# Patient Record
Sex: Male | Born: 1965 | Race: White | Hispanic: No | Marital: Married | Smoking: Never smoker
Health system: Southern US, Community
[De-identification: ages and names within clinical notes are randomized; demographics above are authoritative.]

## PROBLEM LIST (undated history)

## (undated) DIAGNOSIS — M109 Gout, unspecified: Secondary | ICD-10-CM

## (undated) DIAGNOSIS — K200 Eosinophilic esophagitis: Secondary | ICD-10-CM

## (undated) DIAGNOSIS — E221 Hyperprolactinemia: Secondary | ICD-10-CM

---

## 2009-11-01 DIAGNOSIS — C439 Malignant melanoma of skin, unspecified: Secondary | ICD-10-CM

## 2009-11-01 HISTORY — DX: Malignant melanoma of skin, unspecified: C43.9

## 2011-10-03 DIAGNOSIS — D229 Melanocytic nevi, unspecified: Secondary | ICD-10-CM

## 2011-10-03 HISTORY — DX: Melanocytic nevi, unspecified: D22.9

## 2012-08-08 ENCOUNTER — Emergency Department (HOSPITAL_BASED_OUTPATIENT_CLINIC_OR_DEPARTMENT_OTHER)
Admission: EM | Admit: 2012-08-08 | Discharge: 2012-08-09 | Disposition: A | Discharge status: Home / Self Care | Payer: 59 | Attending: Emergency Medicine | Admitting: Emergency Medicine

## 2012-08-08 ENCOUNTER — Encounter (HOSPITAL_BASED_OUTPATIENT_CLINIC_OR_DEPARTMENT_OTHER): Payer: Self-pay | Admitting: Emergency Medicine

## 2012-08-08 DIAGNOSIS — R0789 Other chest pain: Secondary | ICD-10-CM

## 2012-08-08 DIAGNOSIS — R071 Chest pain on breathing: Secondary | ICD-10-CM | POA: Insufficient documentation

## 2012-08-08 NOTE — ED Provider Notes (Signed)
History  This chart was scribed for Hector Brooks Smitty Cords, MD by Shari Heritage, ED Scribe. The patient was seen in room MH02/MH02. Patient's care was started at 2351.   CSN: [id number]  Arrival date & time 08/08/12  2347   First MD Initiated Contact with Patient 08/08/12 2351      Chief Complaint  Patient presents with  . Chest Pain    Patient is a 47 y.o. male presenting with chest pain. The history is provided by the patient. No language interpreter was used.  Chest Pain Pain location:  R chest Pain quality: sharp   Pain radiates to the back: no   Pain severity:  Moderate Onset quality:  Gradual Duration:  14 hours Timing:  Intermittent Progression:  Unchanged Chronicity:  New Relieved by:  Nothing Worsened by:  Nothing tried Ineffective treatments:  Antacids (ibuprofen) Associated symptoms: no abdominal pain, no back pain, no cough, no diaphoresis, no fever, no headache, no nausea, no palpitations, no shortness of breath and not vomiting   Risk factors: no coronary artery disease, no diabetes mellitus, no hypertension and no prior DVT/PE      HPI Comments: Hector Mendez is a 47 y.o. male who presents to the Emergency Department complaining of intermittent, sharp chest pain for the past 14 hours. He states that pain is located across his chest but radiates mostly to the right. Patient states that pain is worse while lying down and is relieved mildly with certain movements. He denies acid-like or burning taste in the back of his throat. He states that he did bench presses 2 days ago and suspects that current symptoms may be muscle strain. He took 2 motrin 1 hour ago and took an antacid a couple hours ago after dinner. He denies leg swelling or hemoptysis. Patient denies vomiting, diarrhea, abdominal pain, cough, shortness of breath, fever, chills, sore throat, congestion, rhinorrhea, headaches, back pain, diaphoresis, dysuria, urinary frequency, hematuria or visual changes. He  hasn't taken any recent long plane or car trips.  Patient uses chewing tobacco and does not drink alcohol. He reports no pertinent past medical history.    History reviewed. No pertinent past medical history.  No past surgical history on file.  No family history on file.  History  Substance Use Topics  . Smoking status: Not on file  . Smokeless tobacco: Not on file  . Alcohol Use: Not on file      Review of Systems  Constitutional: Negative for fever, chills and diaphoresis.  HENT: Negative for congestion, sore throat, rhinorrhea and neck pain.   Eyes: Negative for visual disturbance.  Respiratory: Negative for cough, chest tightness and shortness of breath.   Cardiovascular: Positive for chest pain. Negative for palpitations and leg swelling.  Gastrointestinal: Negative for nausea, vomiting, abdominal pain, diarrhea and constipation.  Genitourinary: Negative for dysuria, frequency and hematuria.  Musculoskeletal: Negative for back pain.  Skin: Negative for rash.  Neurological: Negative for headaches.  All other systems reviewed and are negative.    Allergies  Review of patient's allergies indicates not on file.  Home Medications  No current outpatient prescriptions on file.  Triage Vitals: BP 152/93  Pulse 62  Temp(Src) 97.9 F (36.6 C) (Oral)  Resp 18  SpO2 99%  Physical Exam  Constitutional: He is oriented to person, place, and time. He appears well-developed and well-nourished. No distress.  HENT:  Head: Normocephalic and atraumatic.  Mouth/Throat: Oropharynx is clear and moist.  Eyes: Conjunctivae and EOM are normal. Pupils are  equal, round, and reactive to light.  Neck: Normal range of motion. Neck supple.  Cardiovascular: Normal rate, regular rhythm, normal heart sounds and intact distal pulses.   No murmur heard. Perc negative. Wells 0.   Pulmonary/Chest: Effort normal and breath sounds normal. No respiratory distress. He has no wheezes. He has no  rales. He exhibits no tenderness.  Abdominal: Soft. He exhibits no distension and no mass. There is no tenderness. There is no rebound and no guarding.  Musculoskeletal: Normal range of motion.  Neurological: He is alert and oriented to person, place, and time. He has normal reflexes. He displays normal reflexes.  Skin: Skin is warm and dry. No rash noted.    ED Course  Procedures (including critical care time) DIAGNOSTIC STUDIES: Oxygen Saturation is 99% on room air, normal by my interpretation.    COORDINATION OF CARE: 11:52 PM- Patient informed of current plan for treatment and evaluation and agrees with plan at this time.      Date: 08/08/2012  Rate: 60  Rhythm: normal sinus rhythm  QRS Axis: normal  Intervals: normal  ST/T Wave abnormalities: normal  Conduction Disutrbances:none  Narrative Interpretation: Normal  Old EKG Reviewed: none available     Labs Reviewed - No data to display No results found.   No diagnosis found.   PERC negative and wells 0 MDM  Pain consistent with MSK pain as started bench pressing this week, relieved with toradol.  Worse with movements in a certain direction.  In the setting of > 14 hours of constant pain one negative ekg and troponin is sufficient to exclude ACS. Return for chest pain shortness of breath sweatiness or any concerns.  Follow up with your family doctor for ongoing care.  Patient verbalizes understanding and agrees to follow up     I personally performed the services described in this documentation, which was scribed in my presence. The recorded information has been reviewed and is accurate.    Jasmine Awe, MD 08/09/12 [phone]

## 2012-08-08 NOTE — ED Notes (Signed)
Pt c/o dull pain across top of chest today with intermittent sharp pains.

## 2012-08-09 ENCOUNTER — Emergency Department (HOSPITAL_BASED_OUTPATIENT_CLINIC_OR_DEPARTMENT_OTHER): Payer: 59

## 2012-08-09 LAB — CBC WITH DIFFERENTIAL/PLATELET
Basophils Relative: 0 % (ref 0–1)
Eosinophils Relative: 1 % (ref 0–5)
HCT: 43.7 % (ref 39.0–52.0)
Hemoglobin: 15.3 g/dL (ref 13.0–17.0)
MCH: 30.8 pg (ref 26.0–34.0)
MCHC: 35 g/dL (ref 30.0–36.0)
MCV: 88.1 fL (ref 78.0–100.0)
Monocytes Absolute: 1 10*3/uL (ref 0.1–1.0)
Monocytes Relative: 10 % (ref 3–12)
Neutro Abs: 6.2 10*3/uL (ref 1.7–7.7)
RDW: 12.5 % (ref 11.5–15.5)

## 2012-08-09 LAB — BASIC METABOLIC PANEL
BUN: 14 mg/dL (ref 6–23)
Calcium: 9.8 mg/dL (ref 8.4–10.5)
Chloride: 101 mEq/L (ref 96–112)
Creatinine, Ser: 1 mg/dL (ref 0.50–1.35)
GFR calc Af Amer: >90 mL/min (ref >90)

## 2012-08-09 MED ORDER — MELOXICAM 7.5 MG PO TABS: 7.5000 mg | ORAL_TABLET | Freq: Every day | ORAL | Status: AC

## 2012-08-09 MED ORDER — KETOROLAC TROMETHAMINE 30 MG/ML IJ SOLN
30.0000 mg | Freq: Once | INTRAMUSCULAR | Status: AC
Start: 2012-08-09 — End: 2012-08-09
  Administered 2012-08-09: 30 mg via INTRAVENOUS
  Filled 2012-08-09: qty 1

## 2012-08-09 MED ORDER — GI COCKTAIL ~~LOC~~
30.0000 mL | Freq: Once | ORAL | Status: AC
  Administered 2012-08-09: 30 mL via ORAL
  Filled 2012-08-09: qty 30

## 2013-12-03 IMAGING — CR DG CHEST 2V
2 series · 2 of 2 positions shown · non-contrast
Comparison: None.

CLINICAL DATA: Chest pain.

CHEST - 2 VIEW

[w chest pa]
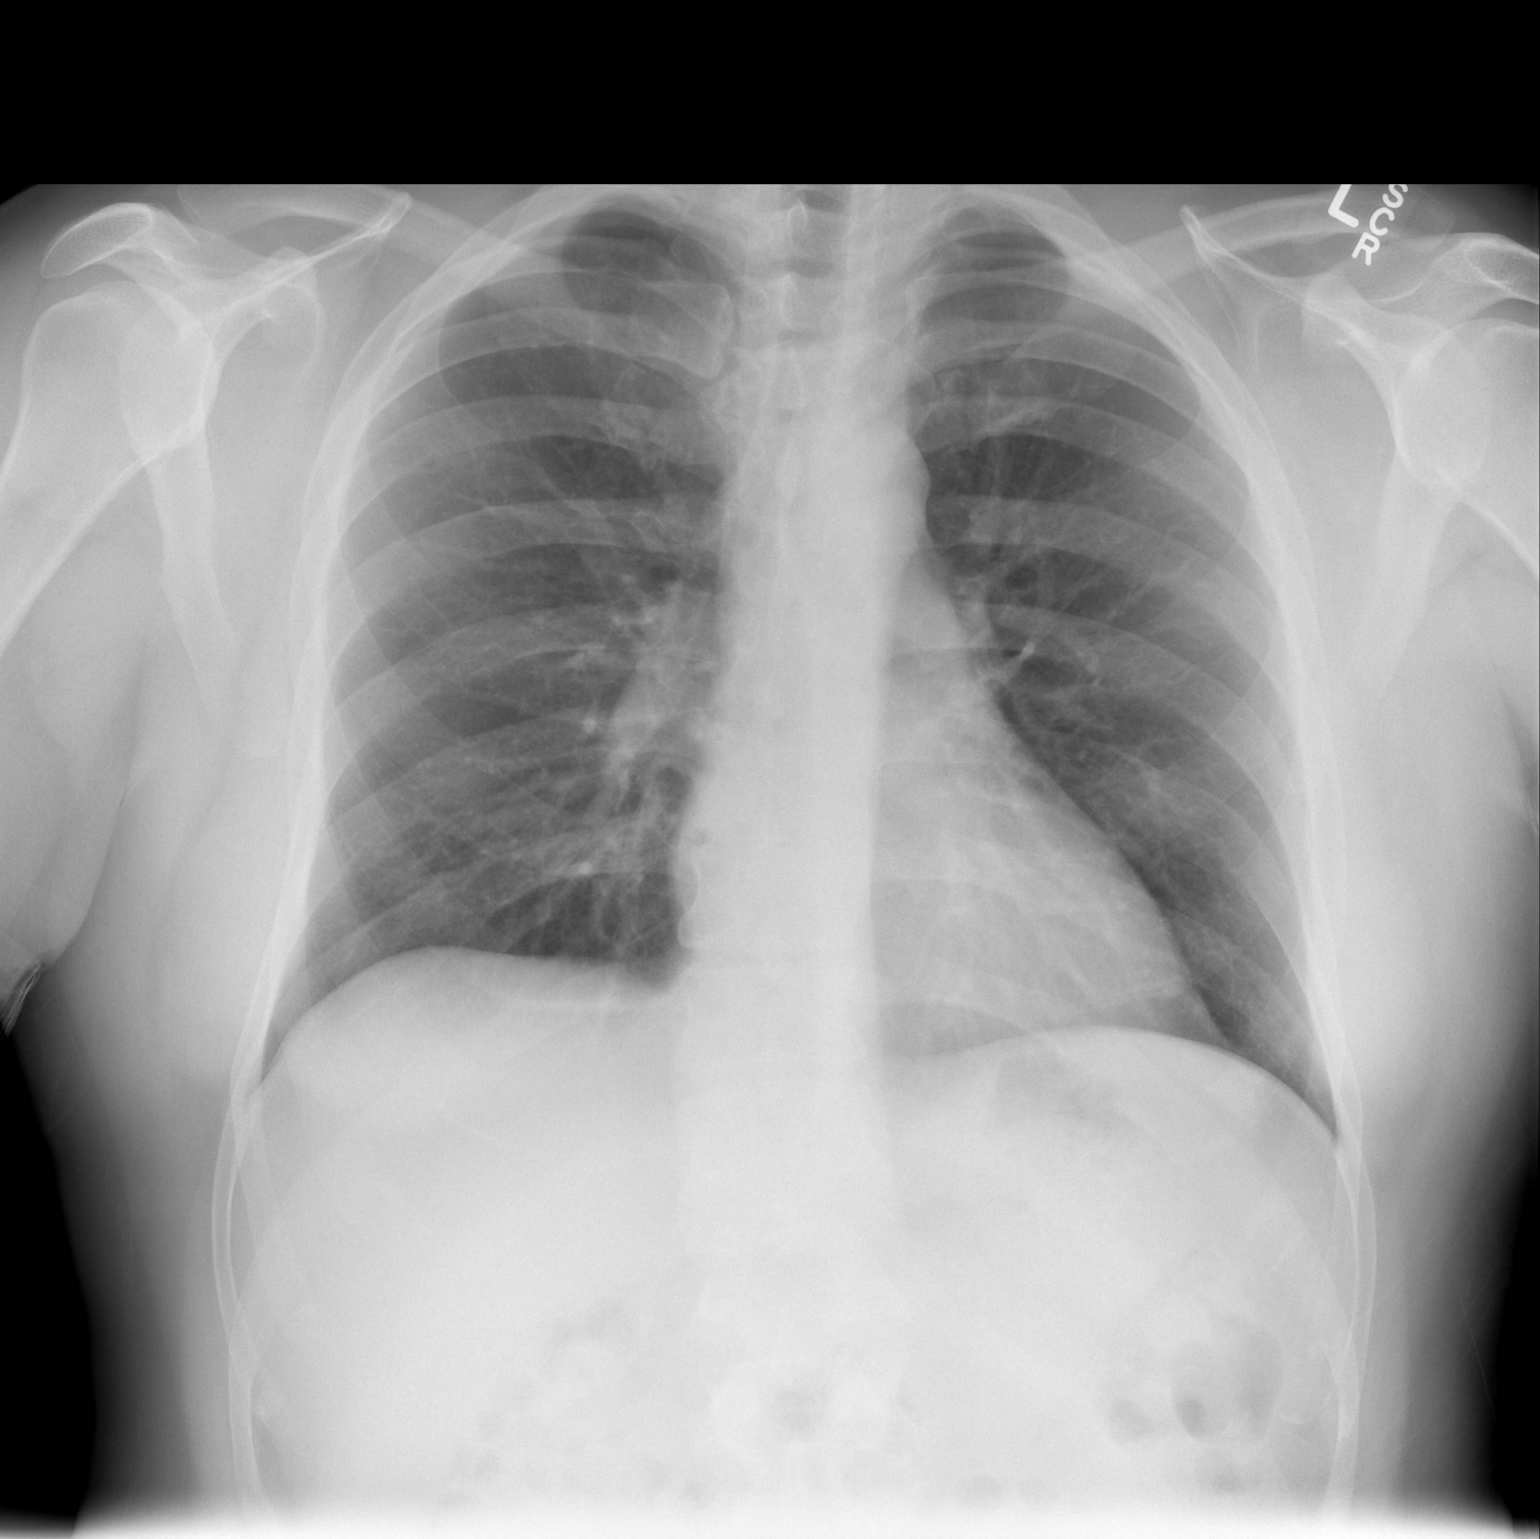

[w chest lat]
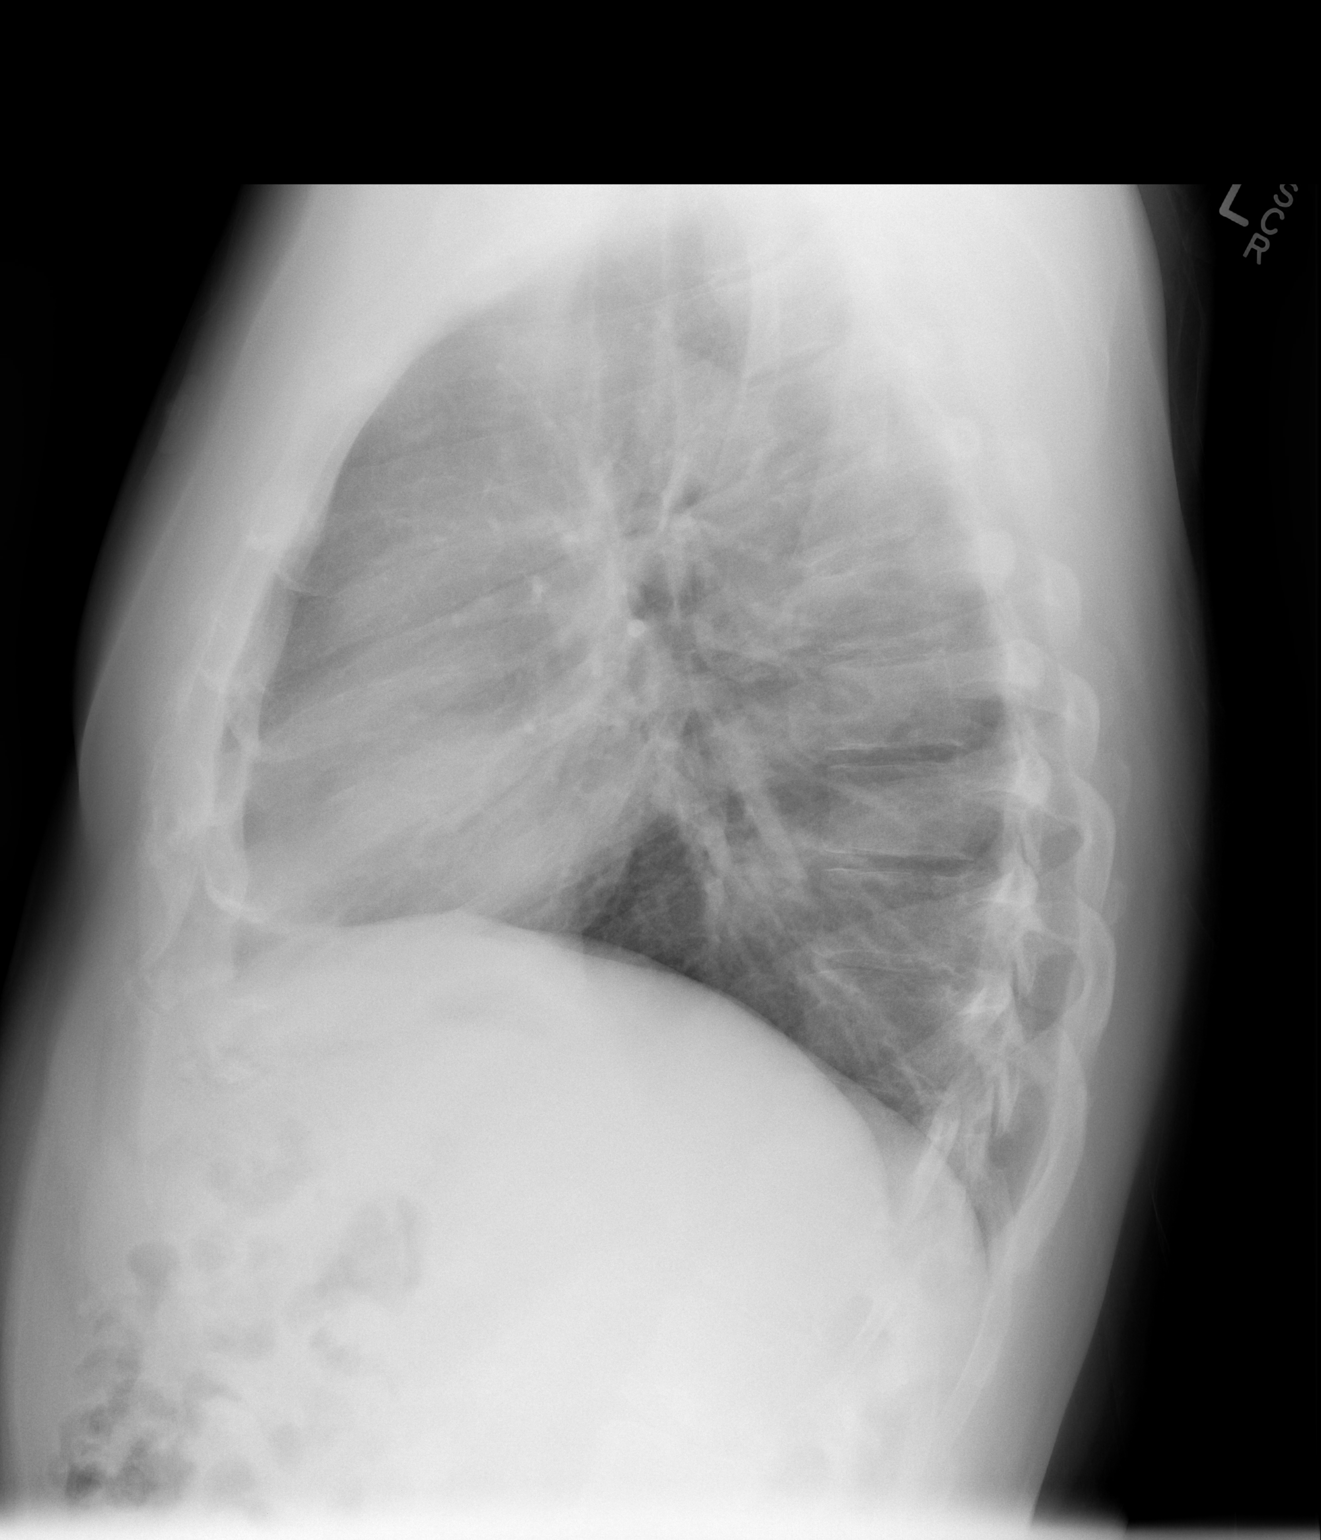

[2 of 2 positions shown; findings below may reference images not displayed]

FINDINGS: The heart size and mediastinal contours are within
normal limits.  Both lungs are clear.  The visualized skeletal
structures are unremarkable.
IMPRESSION: No active cardiopulmonary disease.

## 2022-09-02 ENCOUNTER — Encounter (HOSPITAL_BASED_OUTPATIENT_CLINIC_OR_DEPARTMENT_OTHER): Payer: Self-pay | Admitting: Emergency Medicine

## 2022-09-02 ENCOUNTER — Emergency Department (HOSPITAL_BASED_OUTPATIENT_CLINIC_OR_DEPARTMENT_OTHER): Payer: Managed Care, Other (non HMO)

## 2022-09-02 ENCOUNTER — Other Ambulatory Visit: Payer: Self-pay

## 2022-09-02 ENCOUNTER — Emergency Department (HOSPITAL_BASED_OUTPATIENT_CLINIC_OR_DEPARTMENT_OTHER)
Admission: EM | Admit: 2022-09-02 | Discharge: 2022-09-02 | Disposition: A | Discharge status: Home / Self Care | Payer: Managed Care, Other (non HMO) | Attending: Emergency Medicine | Admitting: Emergency Medicine

## 2022-09-02 DIAGNOSIS — R079 Chest pain, unspecified: Secondary | ICD-10-CM | POA: Diagnosis present

## 2022-09-02 DIAGNOSIS — R0789 Other chest pain: Secondary | ICD-10-CM | POA: Insufficient documentation

## 2022-09-02 DIAGNOSIS — F17290 Nicotine dependence, other tobacco product, uncomplicated: Secondary | ICD-10-CM | POA: Insufficient documentation

## 2022-09-02 HISTORY — DX: Gout, unspecified: M10.9

## 2022-09-02 HISTORY — DX: Eosinophilic esophagitis: K20.0

## 2022-09-02 HISTORY — DX: Hyperprolactinemia: E22.1

## 2022-09-02 LAB — CBC
HCT: 44.8 % (ref 39.0–52.0)
Hemoglobin: 15.6 g/dL (ref 13.0–17.0)
MCH: 31 pg (ref 26.0–34.0)
MCHC: 34.8 g/dL (ref 30.0–36.0)
MCV: 89.1 fL (ref 80.0–100.0)
Platelets: 242 10*3/uL (ref 150–400)
RBC: 5.03 MIL/uL (ref 4.22–5.81)
RDW: 12.5 % (ref 11.5–15.5)
WBC: 7.5 10*3/uL (ref 4.0–10.5)
nRBC: 0 % (ref 0.0–0.2)

## 2022-09-02 LAB — BASIC METABOLIC PANEL
Anion gap: 7 (ref 5–15)
BUN: 15 mg/dL (ref 6–20)
CO2: 25 mmol/L (ref 22–32)
Calcium: 8.6 mg/dL — ABNORMAL LOW (ref 8.9–10.3)
Chloride: 103 mmol/L (ref 98–111)
Creatinine, Ser: 0.96 mg/dL (ref 0.61–1.24)
GFR, Estimated: >60 mL/min (ref >60)
Glucose, Bld: 129 mg/dL — ABNORMAL HIGH (ref 70–99)
Potassium: 3.7 mmol/L (ref 3.5–5.1)
Sodium: 135 mmol/L (ref 135–145)

## 2022-09-02 LAB — TROPONIN I (HIGH SENSITIVITY)
Troponin I (High Sensitivity): 3 ng/L (ref <18)
Troponin I (High Sensitivity): 4 ng/L (ref <18)

## 2022-09-02 LAB — D-DIMER, QUANTITATIVE: D-Dimer, Quant: 0.3 ug/mL-FEU (ref 0.00–0.50)

## 2022-09-02 NOTE — ED Provider Notes (Signed)
[facility] Provider Note   CSN: [id number] Arrival date & time: 09/02/22  1128     History  Chief Complaint  Patient presents with   Chest Pain    Hector Mendez is a 57 y.o. male.  Patient presents to the emergency department complaining of left-sided chest pain.  Patient states that he began to feel a dull ache in the lower left chest yesterday afternoon at approximately 3 PM.  He states that the pain has been intermittent at times.  Pain has fluctuated from a 6 out of 10 in severity to a 4 out of 10 in severity.  Currently rates the pain as a 4-10 in severity.  He denies associated shortness of breath, nausea, vomiting.  He does endorse diaphoresis associated with the pain.  He denies trying anything for the pain at home.  He states he was able to sleep without difficulty last night.  He states this morning he got up and went to the bathroom and the pain resumed along with a diaphoresis, prompting the emergency department visit.  He denies any previous cardiac history.  He states he is a [employer] and had some shortness of breath a little over a year ago and had a full workup including pulmonology and cardiology involving function test and stress test.  He states that he was given a clean bill of health and was told that his shortness of breath was due to conditioning, not an underlying condition.  Patient states that he has no new shortness of breath at this time.  Past medical history significant for current smokeless tobacco use.  HPI     Home Medications Prior to Admission medications   Medication Sig Start Date End Date Taking? Authorizing Provider  ibuprofen (ADVIL,MOTRIN) 200 MG tablet Take 400 mg by mouth every 6 (six) hours as needed for pain.    [provider]  meloxicam (MOBIC) 7.5 MG tablet Take 1 tablet (7.5 mg total) by mouth daily. 08/09/12   Palumbo, April, MD      Allergies    Lactose and Wheat     Review of Systems   Review of Systems  Physical Exam Updated Vital Signs BP (!) 155/90   Pulse (!) 50   Temp 98.1 F (36.7 C) (Oral)   Resp 15   Ht 5' 11.5" (1.816 m)   Wt 108.9 kg   SpO2 98%   BMI 33.01 kg/m  Physical Exam Vitals and nursing note reviewed.  Constitutional:      General: He is not in acute distress.    Appearance: He is well-developed.  HENT:     Head: Normocephalic and atraumatic.  Eyes:     Conjunctiva/sclera: Conjunctivae normal.  Cardiovascular:     Rate and Rhythm: Normal rate and regular rhythm.     Heart sounds: No murmur heard. Pulmonary:     Effort: Pulmonary effort is normal. No respiratory distress.     Breath sounds: Normal breath sounds.  Chest:     Chest wall: No tenderness.  Abdominal:     Palpations: Abdomen is soft.     Tenderness: There is no abdominal tenderness.  Musculoskeletal:        General: No swelling.     Cervical back: Neck supple.  Skin:    General: Skin is warm and dry.     Capillary Refill: Capillary refill takes less than 2 seconds.  Neurological:     Mental Status: He is  alert.  Psychiatric:        Mood and Affect: Mood normal.     ED Results / Procedures / Treatments   Labs (all labs ordered are listed, but only abnormal results are displayed) Labs Reviewed  BASIC METABOLIC PANEL - Abnormal; Notable for the following components:      Result Value   Glucose, Bld 129 (*)    Calcium 8.6 (*)    All other components within normal limits  CBC  D-DIMER, QUANTITATIVE  TROPONIN I (HIGH SENSITIVITY)  TROPONIN I (HIGH SENSITIVITY)    EKG EKG Interpretation  Date/Time:  Sunday Sep 02 2022 13:49:40 EDT Ventricular Rate:  52 PR Interval:  158 QRS Duration: 98 QT Interval:  449 QTC Calculation: 418 R Axis:   57 Text Interpretation: Sinus rhythm Low voltage, precordial leads No significant change was found Confirmed by Glynn Octave [phone]) on 09/02/2022 1:52:33 PM  Radiology DG Chest Port 1  View  Result Date: 09/02/2022 CLINICAL DATA:  Left-sided chest pain for 18-24 hours. EXAM: PORTABLE CHEST 1 VIEW COMPARISON:  08/09/2012. FINDINGS: Normal heart, mediastinum and hila. Clear lungs.  No pleural effusion or pneumothorax. Skeletal structures are grossly intact. IMPRESSION: No active disease. Electronically Signed   By: Amie Portland M.D.   On: 09/02/2022 12:18    Procedures Procedures    Medications Ordered in ED Medications - No data to display  ED Course/ Medical Decision Making/ A&P                             Medical Decision Making Amount and/or Complexity of Data Reviewed Labs: ordered. Radiology: ordered. ECG/medicine tests: ordered.   This patient presents to the ED for concern of chest pain, this involves an extensive number of treatment options, and is a complaint that carries with it a high risk of complications and morbidity.  The differential diagnosis includes ACS, PE, GERD, dissection, pneumonia, musculoskeletal pain, others   Co morbidities that complicate the patient evaluation  GERD   Additional history obtained:  Additional history obtained from family at bedside External records from outside source obtained and reviewed including stress test results showing grossly normal cardiac stress test   Lab Tests:  I Ordered, and personally interpreted labs.  The pertinent results include: Grossly unremarkable CBC, BMP; negative D-dimer; negative troponin x 2   Imaging Studies ordered:  I ordered imaging studies including chest x-ray I independently visualized and interpreted imaging which showed no acute findings I agree with the radiologist interpretation   Cardiac Monitoring: / EKG:  The patient was maintained on a cardiac monitor.  I personally viewed and interpreted the cardiac monitored which showed an underlying rhythm of: Sinus rhythm   Test / Admission - Considered:  Patient with negative troponins x 2, nonischemic EKG.  Low  suspicion of ACS.  Negative D-dimer.  Low suspicion of PE.  No clinical signs of dissection.  No pneumonia on chest x-ray.  Patient does have history of GERD and question if he may have some pain related to the underlying GERD.  At this time no emergent causes been identified and the patient feels comfortable discharging home.  This seems perfectly reasonable.  There is no indication for admission.  Discharge home with return precautions.         Final Clinical Impression(s) / ED Diagnoses Final diagnoses:  Atypical chest pain    Rx / DC Orders ED Discharge Orders     None  Pamala Duffel 09/02/22 1541    Glynn Octave, MD 09/05/22 1236

## 2022-09-02 NOTE — Discharge Instructions (Signed)
You were evaluated today for chest pain. Your workup was reassuring for no signs of acute coronary syndrome or clot burden. Please follow up as needed with your primary care provider.

## 2022-09-02 NOTE — ED Notes (Signed)
Light green top blood tube submitted to lab, per Charge RN request.

## 2022-09-02 NOTE — ED Triage Notes (Signed)
Pt reports pain to LT chest/ribs x 18-24 hours; reports intermittent diaphoresis last night; reports some intermittent numbness/tingling to LT pinky and ring finger that started about 0900
